# Patient Record
Sex: Female | Born: 1995 | Race: White | Hispanic: No | Marital: Single | Smoking: Never smoker
Health system: Southern US, Community
[De-identification: ages and names within clinical notes are randomized; demographics above are authoritative.]

## PROBLEM LIST (undated history)

## (undated) DIAGNOSIS — J358 Other chronic diseases of tonsils and adenoids: Secondary | ICD-10-CM

---
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
## 2017-03-03 ENCOUNTER — Emergency Department: Payer: 59

## 2017-03-03 ENCOUNTER — Emergency Department
Admission: EM | Admit: 2017-03-03 | Discharge: 2017-03-03 | Disposition: A | Discharge status: Home / Self Care | Payer: 59 | Attending: Emergency Medicine | Admitting: Emergency Medicine

## 2017-03-03 ENCOUNTER — Encounter: Payer: Self-pay | Admitting: Medical Oncology

## 2017-03-03 DIAGNOSIS — Y999 Unspecified external cause status: Secondary | ICD-10-CM | POA: Insufficient documentation

## 2017-03-03 DIAGNOSIS — Y939 Activity, unspecified: Secondary | ICD-10-CM | POA: Diagnosis not present

## 2017-03-03 DIAGNOSIS — S61241A Puncture wound with foreign body of left index finger without damage to nail, initial encounter: Secondary | ICD-10-CM | POA: Diagnosis not present

## 2017-03-03 DIAGNOSIS — S61531A Puncture wound without foreign body of right wrist, initial encounter: Secondary | ICD-10-CM | POA: Diagnosis present

## 2017-03-03 DIAGNOSIS — Y929 Unspecified place or not applicable: Secondary | ICD-10-CM | POA: Insufficient documentation

## 2017-03-03 DIAGNOSIS — S61451A Open bite of right hand, initial encounter: Secondary | ICD-10-CM

## 2017-03-03 DIAGNOSIS — W540XXA Bitten by dog, initial encounter: Secondary | ICD-10-CM | POA: Insufficient documentation

## 2017-03-03 DIAGNOSIS — S61452A Open bite of left hand, initial encounter: Secondary | ICD-10-CM

## 2017-03-03 MED ORDER — HYDROCODONE-ACETAMINOPHEN 5-325 MG PO TABS: 1.0000 | ORAL_TABLET | Freq: Four times a day (QID) | ORAL | 0 refills | Status: AC | PRN

## 2017-03-03 MED ORDER — OXYCODONE-ACETAMINOPHEN 5-325 MG PO TABS
2.0000 | ORAL_TABLET | Freq: Once | ORAL | Status: AC
  Administered 2017-03-03: 2 via ORAL

## 2017-03-03 MED ORDER — OXYCODONE-ACETAMINOPHEN 5-325 MG PO TABS
ORAL_TABLET | ORAL | Status: AC
  Filled 2017-03-03: qty 2

## 2017-03-03 MED ORDER — OXYCODONE-ACETAMINOPHEN 5-325 MG PO TABS: 1.0000 | ORAL_TABLET | Freq: Once | ORAL | Status: DC

## 2017-03-03 MED ORDER — DOXYCYCLINE HYCLATE 100 MG PO CAPS: 100.0000 mg | ORAL_CAPSULE | Freq: Two times a day (BID) | ORAL | 0 refills | Status: AC

## 2017-03-03 MED ORDER — IBUPROFEN 800 MG PO TABS: 800.0000 mg | ORAL_TABLET | Freq: Three times a day (TID) | ORAL | 0 refills | Status: AC | PRN

## 2017-03-03 NOTE — ED Notes (Signed)
Reviewed d/c instructions, follow-up care, prescriptions, wound care with patient. Pt verbalized understanding.  

## 2017-03-03 NOTE — ED Notes (Signed)
Applied xeroform and sterile gauzed to lacerations per PA request.

## 2017-03-03 NOTE — ED Provider Notes (Signed)
[facility] Emergency Department Provider Note   ____________________________________________   I have reviewed the triage vital signs and the nursing notes.   HISTORY  Chief Complaint Animal Bite    HPI Kelly Fowler is a 21 y.o. female presents to emergency department after being bitten on both hands by a dog this evening. Patient sustained a puncture bite wounds to the left index finger and hand area as well as puncture wounds to the right wrist area. Patient did not know the dog she had been bitten by and the rabies status was was confirmed by the [facility] Department. Patient denied any other injuries during the dog bite attack. Patient fell on the ground during the attack and bite wounds became contaminated with dirt and debris from the ground. Patient recently received her tetanus vaccine earlier this year. Patient denies fever, chills, headache, vision changes, chest pain, chest tightness, shortness of breath, abdominal pain, nausea and vomiting.  Past Medical History:  Diagnosis Date  . Tonsillolith     There are no active problems to display for this patient.   Past Surgical History:  Procedure Laterality Date  . TONSILLECTOMY N/A 07/24/2015   Procedure: TONSILLECTOMY;  Surgeon: Linus Salmons, MD;  [facility];  Service: ENT;  Laterality: N/A;  UPREG    Prior to Admission medications   Medication Sig Start Date End Date Taking? Authorizing Provider  doxycycline (VIBRAMYCIN) 100 MG capsule Take 1 capsule (100 mg total) by mouth 2 (two) times daily. 03/03/17 03/08/17  Sabriyah Wilcher M, PA-C  drospirenone-ethinyl estradiol (YAZ,GIANVI,LORYNA) 3-0.02 MG tablet Take 1 tablet by mouth daily.    [provider]  HYDROcodone-acetaminophen (NORCO/VICODIN) 5-325 MG tablet Take 1 tablet by mouth every 6 (six) hours as needed for moderate pain. 03/03/17   Dalores Weger M, PA-C  ibuprofen (ADVIL,MOTRIN) 800 MG tablet  Take 1 tablet (800 mg total) by mouth every 8 (eight) hours as needed for moderate pain. 03/03/17   Airyana Sprunger M, PA-C  SUMAtriptan (IMITREX) 25 MG tablet Take 25 mg by mouth 2 (two) times daily. May repeat in 2 hours if headache persists or recurs.    [provider]    Allergies Amoxicillin  No family history on file.  Social History Social History  Substance Use Topics  . Smoking status: Never Smoker  . Smokeless tobacco: Not on file  . Alcohol use No    Review of Systems Constitutional: Negative for fever/chills Eyes: No visual changes. ENT:  Negative for sore throat and for difficulty swallowing Cardiovascular: Denies chest pain. Respiratory: Denies cough. Denies shortness of breath. Gastrointestinal: No abdominal pain.  No nausea, vomiting, diarrhea. Genitourinary: Negative for dysuria. Musculoskeletal: Negative for back pain. Skin: Negative for rash. Puncture wounds and small lacerations along the palmar aspect of the left wrist and index finger of the right hand secondary to dog bite. Neurological: Negative for headaches.  Negative focal weakness or numbness. Negative for loss of consciousness. Able to ambulate. ____________________________________________   PHYSICAL EXAM:  VITAL SIGNS: Patient Vitals for the past 24 hrs:  BP Temp Temp src Pulse Resp SpO2 Height Weight  03/03/17 2045 126/83 98.3 F (36.8 C) Oral 100 20 100 % - -  03/03/17 1902 (!) 135/93 98.4 F (36.9 C) Oral (!) 103 20 100 % - -  03/03/17 1859 - - - - - - 5\' 4"  (1.626 m) 52.2 kg (115 lb)    Constitutional: Alert and oriented. Well appearing and in no  acute distress.  Eyes: Conjunctivae are normal. PERRL. EOMI  Head: Normocephalic and atraumatic. ENT:      Ears: Canals clear. TMs intact bilaterally.      Nose: No congestion/rhinnorhea.      Mouth/Throat: Mucous membranes are moist.  Neck:Supple. No thyromegaly. No stridor.  Cardiovascular: Normal rate, regular rhythm. Normal S1 and  S2.  Good peripheral circulation. Respiratory: Normal respiratory effort without tachypnea or retractions. Lungs CTAB. Good air entry to the bases with no decreased or absent breath sounds. Hematological/Lymphatic/Immunological: No cervical lymphadenopathy. Cardiovascular: Normal rate, regular rhythm. Normal distal pulses. Respiratory: Normal respiratory effort. No wheezes/rales/rhonchi. Lungs CTAB with no W/R/R. Gastrointestinal: Bowel sounds 4 quadrants. Soft and nontender to palpation. No guarding or rigidity. No palpable masses. No distention. No CVA tenderness. Musculoskeletal: Bilateral hand and fingers sensation, pulses and movement intact. No tendinous involvement in the area of wounds. Nontender with normal range of motion in all extremities. Neurologic: Normal speech and language. No gross focal neurologic deficits are appreciated. No gait instability. Cranial nerves: II-X intact. No sensory loss or abnormal reflexes.  Skin:  Skin is warm, dry and intact. No rash noted. Multiple puncture wounds and small lacerations along the palmar aspect of the left wrist and index finger of the right hand with dirt and debris embedded in and around the wound areas. Psychiatric: Mood and affect are normal. Speech and behavior are normal. Patient exhibits appropriate insight and judgement.  ____________________________________________   LABS (all labs ordered are listed, but only abnormal results are displayed)  Labs Reviewed - No data to display ____________________________________________  EKG None ____________________________________________  RADIOLOGY DG right hand complete IMPRESSION: Soft tissue lacerations along the proximal hand/wrist.  No fracture, dislocation, or radiopaque foreign body is seen.  DG left hand complete FINDINGS: There is no evidence of fracture or dislocation. There is no evidence of arthropathy or other focal bone abnormality. Soft tissues are unremarkable. No  radiopaque foreign body.  IMPRESSION: Negative exam. ____________________________________________   PROCEDURES  Procedure(s) performed:  The wound areas were irrigated and cleaned with normal saline and chlorhexidine. Wounds were free of all debris following irrigation. Wounds were dressed with Xeroform and covered with Kerlix.     Critical Care performed: no ____________________________________________   INITIAL IMPRESSION / ASSESSMENT AND PLAN / ED COURSE  Pertinent labs & imaging results that were available during my care of the patient were reviewed by me and considered in my medical decision making (see chart for details).  Patient presents to emergency department with puncture wounds and laceration she sustained from a dog bite earlier this evening on her right and left hands.. History, physical exam and imaging are reassuring of no acute fracture. Wound care cleared the wound of all debris and wounds were dressed accordingly. Patient prescribed doxycycline for antibody coverage and short course of Vicodin for severe pain management. Patient will transition to ibuprofen once severe pain improves. Patient advised to follow up with PCP as needed or return to the emergency department if symptoms return or worsen. Patient informed of clinical course, understand medical decision-making process, and agree with plan.    If controlled substance prescribed during this visit, 12 month history viewed on the [facility] prior to issuing an initial prescription for Schedule II or III opiod. ____________________________________________   FINAL CLINICAL IMPRESSION(S) / ED DIAGNOSES  Final diagnoses:  Dog bite of right hand, initial encounter  Dog bite of left hand, initial encounter       NEW MEDICATIONS STARTED DURING THIS VISIT:  Discharge Medication List as of 03/03/2017  8:39 PM    START taking these medications   Details  doxycycline (VIBRAMYCIN) 100 MG capsule Take 1 capsule (100  mg total) by mouth 2 (two) times daily., Starting Fri 03/03/2017, Until Wed 03/08/2017, Print    HYDROcodone-acetaminophen (NORCO/VICODIN) 5-325 MG tablet Take 1 tablet by mouth every 6 (six) hours as needed for moderate pain., Starting Fri 03/03/2017, Print    ibuprofen (ADVIL,MOTRIN) 800 MG tablet Take 1 tablet (800 mg total) by mouth every 8 (eight) hours as needed for moderate pain., Starting Fri 03/03/2017, Print         Note:  This document was prepared using Dragon voice recognition software and may include unintentional dictation errors.    Clois ComberLittle, Burnette Valenti M, PA-C 03/04/17 0014    Phineas SemenGoodman, [place], MD 03/04/17 [phone]

## 2017-03-03 NOTE — Discharge Instructions (Signed)
Keep wound areas clean dry and covered. Take Vicodin for moderate to severe pain as directed and transitioned to ibuprofen when severe pain improves.

## 2017-03-03 NOTE — ED Triage Notes (Signed)
Pt has dog bites to left and rt hands.

## 2019-11-15 ENCOUNTER — Other Ambulatory Visit: Payer: Self-pay | Admitting: Family Medicine

## 2019-11-15 DIAGNOSIS — M5442 Lumbago with sciatica, left side: Secondary | ICD-10-CM

## 2019-11-26 ENCOUNTER — Ambulatory Visit: Payer: Managed Care, Other (non HMO)

## 2019-12-08 ENCOUNTER — Ambulatory Visit
Admission: RE | Admit: 2019-12-08 | Discharge: 2019-12-08 | Disposition: A | Discharge status: Home / Self Care | Payer: Managed Care, Other (non HMO) | Source: Ambulatory Visit | Attending: Family Medicine | Admitting: Family Medicine

## 2019-12-08 ENCOUNTER — Other Ambulatory Visit: Payer: Self-pay

## 2019-12-08 DIAGNOSIS — M5442 Lumbago with sciatica, left side: Secondary | ICD-10-CM | POA: Insufficient documentation

## 2021-10-01 IMAGING — MR MR LUMBAR SPINE W/O CM
5 series · 31 of 48 positions shown · non-contrast
Comparison: Prior radiograph from 09/09/2019.

CLINICAL DATA: Initial evaluation for left lower back pain with
left lower extremity pain since injury in Monday September, 2019.

EXAM:
MRI LUMBAR SPINE WITHOUT CONTRAST
TECHNIQUE: Multiplanar, multisequence MR imaging of the lumbar spine was
performed. No intravenous contrast was administered.

[Series 5: T2 · sagittal · 4.0mm · 0.88mm/px · 6 of 15 slices shown (1 of 2)]
[im 1/15]
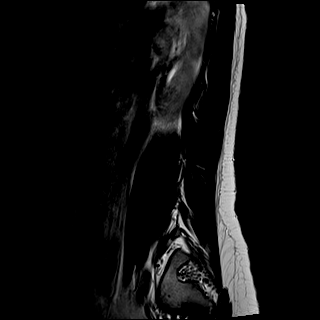
[im 3/15]
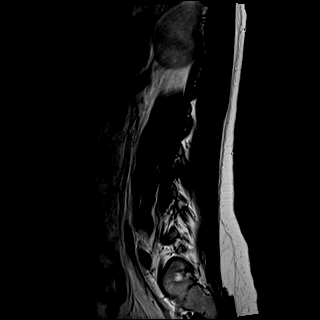
[im 6/15]
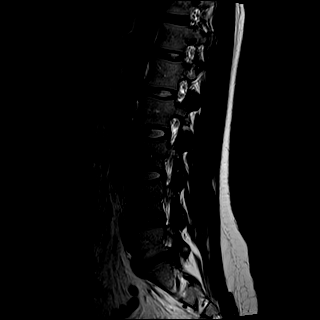
[im 9/15]
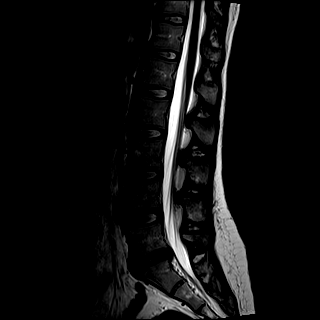
[im 12/15]
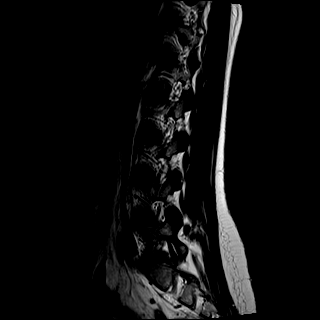
[im 15/15]
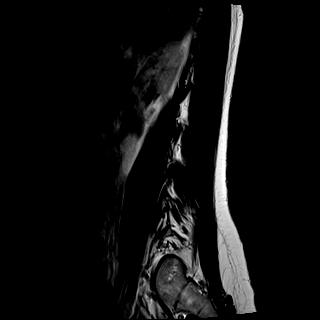

[Series 6: T1 · sagittal · 4.0mm · 0.88mm/px · 6 of 15 slices shown (1 of 2)]
[im 1/15]
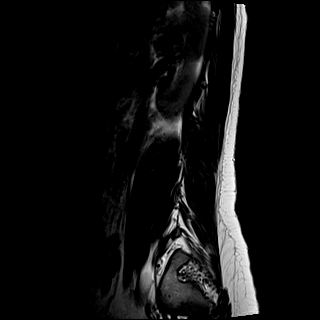
[im 3/15]
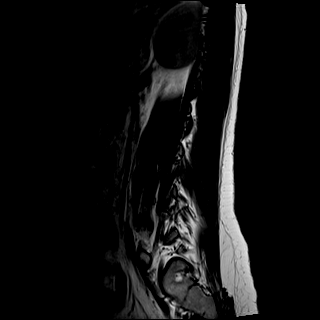
[im 6/15]
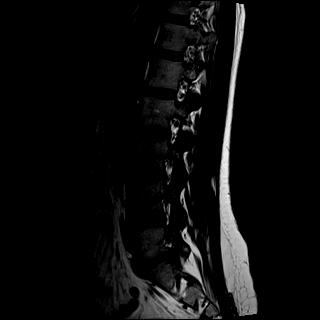
[im 9/15]
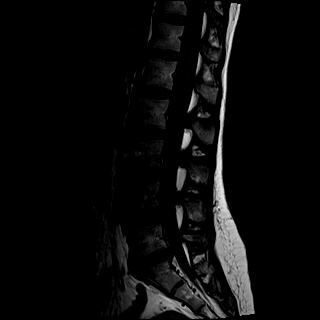
[im 12/15]
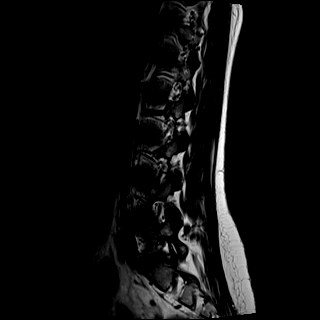
[im 15/15]
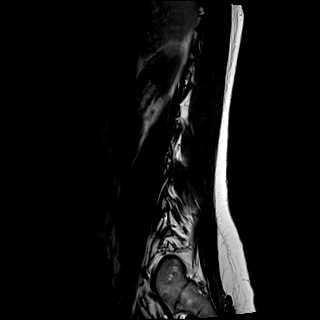

[Series 7: STIR · sagittal · 4.0mm · 0.44mm/px · 1 of 15 slices shown]
[im 1/15]
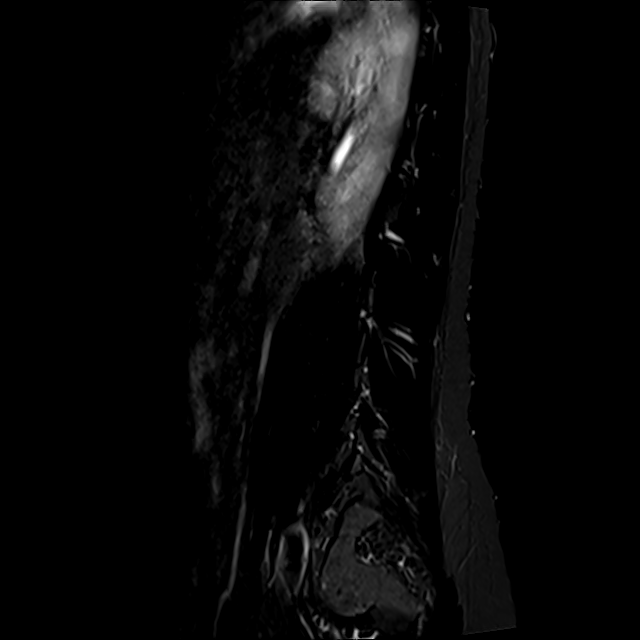

[Series 8: T2 · axial · 4.0mm · 0.78mm/px · z∈[-141,+92]mm · 9 of 36 slices shown (2 of 2)]
[im 1/36]
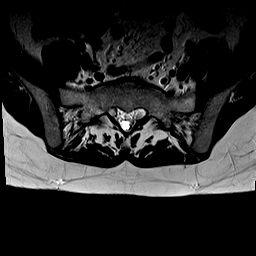
[im 6/36]
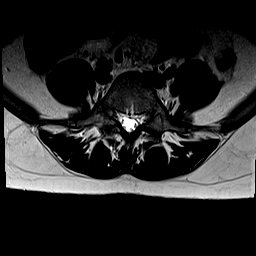
[im 11/36]
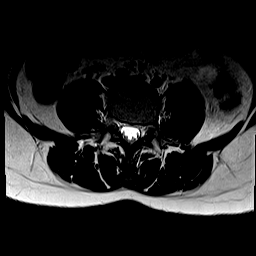
[im 16/36]
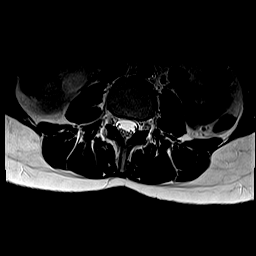
[im 18/36]
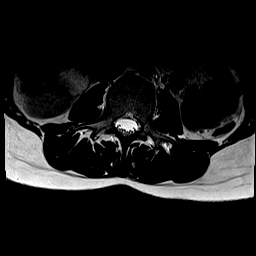
[im 21/36]
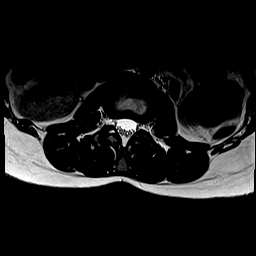
[im 26/36]
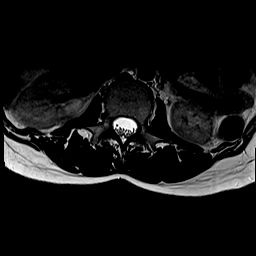
[im 31/36]
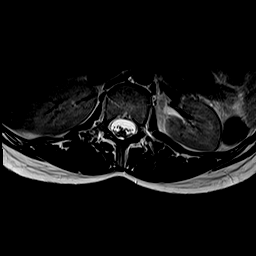
[im 36/36]
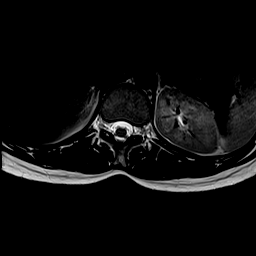

[Series 9: T1 · axial · 4.0mm · 0.39mm/px · z∈[-141,+92]mm · 9 of 36 slices shown (2 of 2)]
[im 1/36]
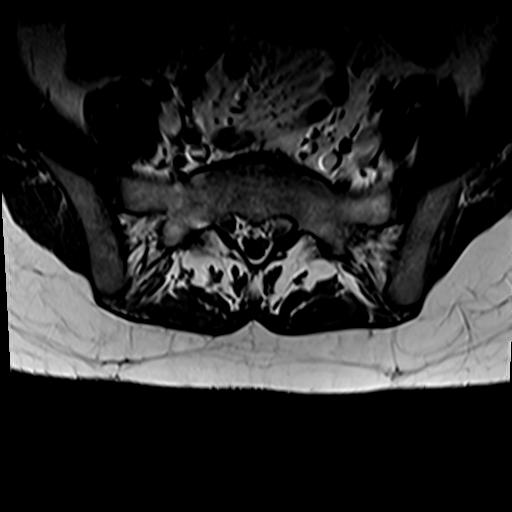
[im 6/36]
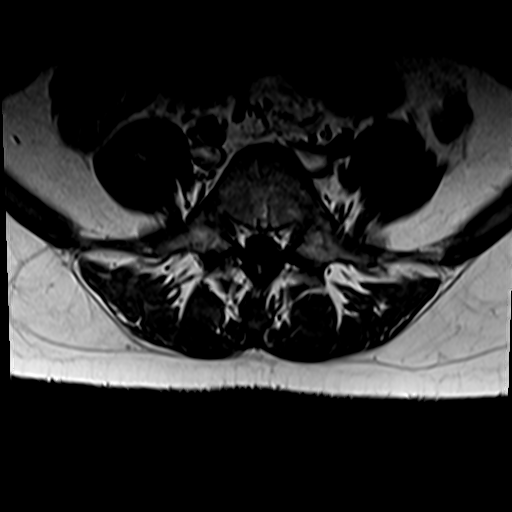
[im 11/36]
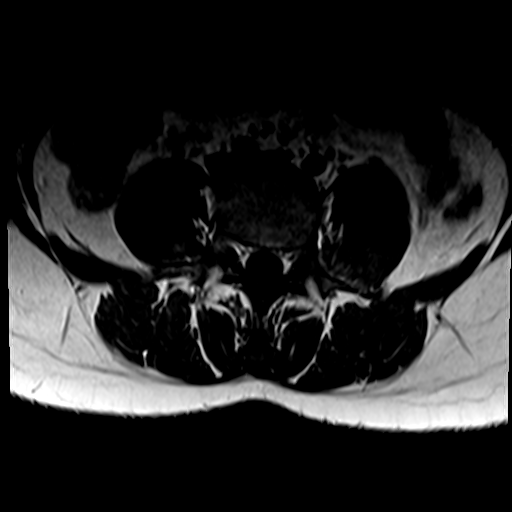
[im 16/36]
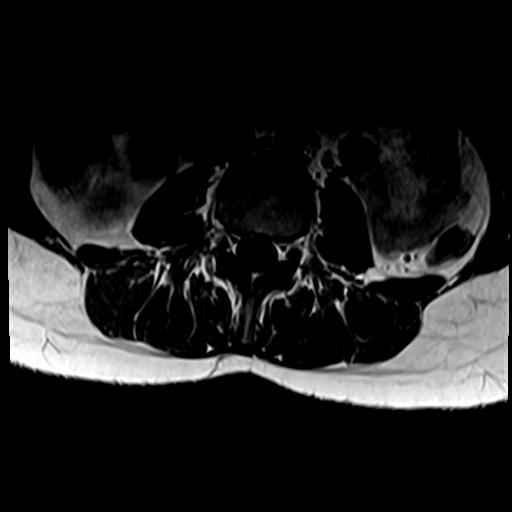
[im 18/36]
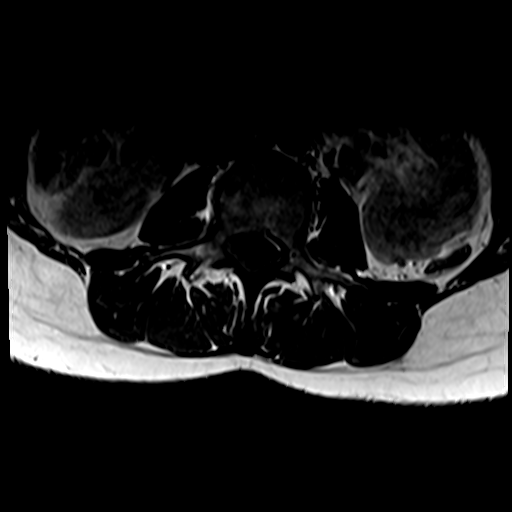
[im 21/36]
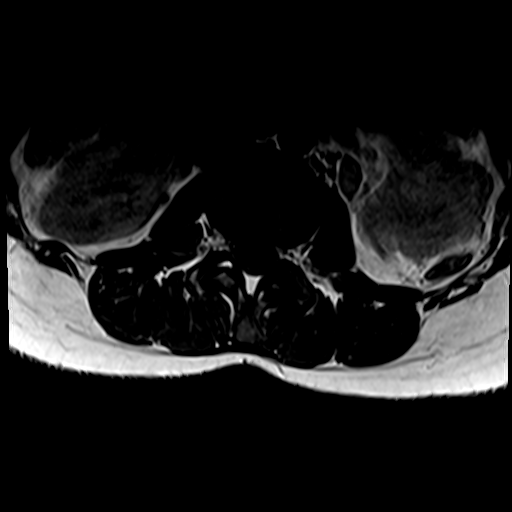
[im 26/36]
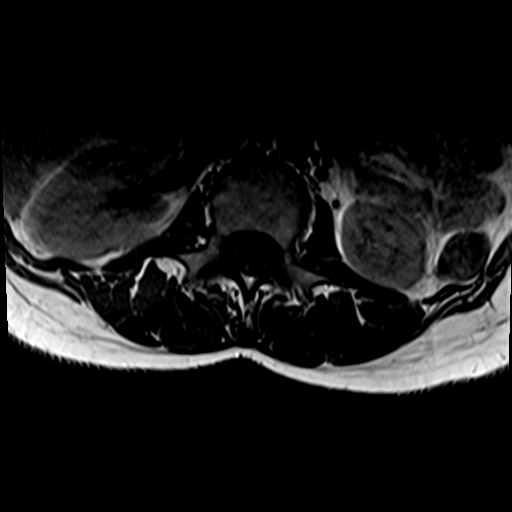
[im 31/36]
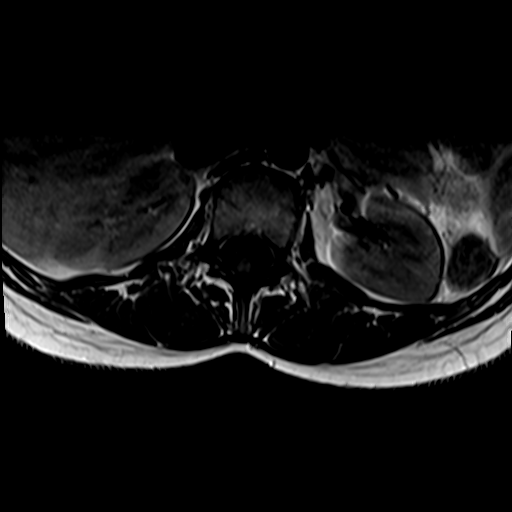
[im 36/36]
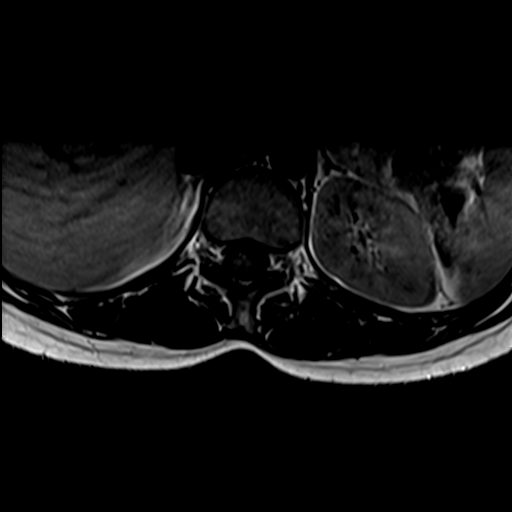

[31 of 48 positions shown; findings below may reference images not displayed]

FINDINGS: Segmentation: Standard. Lowest well-formed disc space labeled the
L5-S1 level.

Alignment: Physiologic with preservation of the normal lumbar
lordosis. No listhesis.

Vertebrae: Vertebral body height maintained without evidence for
acute or chronic fracture. Bone marrow signal intensity within
normal limits. Few scattered subcentimeter benign hemangiomata
noted. No worrisome osseous lesions. No abnormal marrow edema.

Conus medullaris and cauda equina: Conus extends to the L1-2 level.
Conus and cauda equina appear normal.

Paraspinal and other soft tissues: Unremarkable.

Disc levels:

No significant findings are seen through the L4-5 level.

L5-S1: Disc desiccation with mild intervertebral disc space
narrowing. Superimposed broad-based left subarticular disc
protrusion with slight inferior migration. Disc material extends
into the lateral recess, impinging upon the descending left S1 nerve
root. Slight lateral extension towards the left neural foramen noted
as well. Resultant severe left lateral recess stenosis, with mild to
moderate left neural foraminal narrowing. Central canal remains
patent as is the right neural foramen.
IMPRESSION: 1. Moderate-sized left subarticular disc protrusion at L5-S1,
impinging upon the descending left S1 nerve root in the left lateral
recess.
2. Otherwise normal MRI of the lumbar spine.
